# Patient Record
Sex: Female | Born: 1985 | Hispanic: Yes | Marital: Single | Smoking: Never smoker
Health system: Southern US, Community
[De-identification: ages and names within clinical notes are randomized; demographics above are authoritative.]

## PROBLEM LIST (undated history)

## (undated) DIAGNOSIS — R519 Headache, unspecified: Secondary | ICD-10-CM

---

## 2004-08-02 ENCOUNTER — Ambulatory Visit: Payer: Self-pay | Admitting: Family Medicine

## 2004-08-04 ENCOUNTER — Inpatient Hospital Stay: Payer: Self-pay

## 2006-04-17 ENCOUNTER — Inpatient Hospital Stay: Payer: Self-pay | Admitting: Obstetrics and Gynecology

## 2013-03-13 DIAGNOSIS — N879 Dysplasia of cervix uteri, unspecified: Secondary | ICD-10-CM | POA: Insufficient documentation

## 2016-10-26 HISTORY — PX: LEEP: SHX91

## 2019-05-24 ENCOUNTER — Other Ambulatory Visit: Payer: Self-pay | Admitting: Physician Assistant

## 2019-05-24 DIAGNOSIS — N6310 Unspecified lump in the right breast, unspecified quadrant: Secondary | ICD-10-CM

## 2019-06-03 ENCOUNTER — Ambulatory Visit
Admission: RE | Admit: 2019-06-03 | Discharge: 2019-06-03 | Disposition: A | Discharge status: Home / Self Care | Payer: Self-pay | Source: Ambulatory Visit | Attending: Physician Assistant | Admitting: Physician Assistant

## 2019-06-03 ENCOUNTER — Other Ambulatory Visit: Payer: Self-pay

## 2019-06-03 DIAGNOSIS — N6310 Unspecified lump in the right breast, unspecified quadrant: Secondary | ICD-10-CM

## 2019-06-05 ENCOUNTER — Ambulatory Visit: Payer: Self-pay | Attending: Oncology | Admitting: *Deleted

## 2019-06-05 ENCOUNTER — Other Ambulatory Visit: Payer: Self-pay

## 2019-06-05 ENCOUNTER — Encounter: Payer: Self-pay | Admitting: *Deleted

## 2019-06-05 DIAGNOSIS — N63 Unspecified lump in unspecified breast: Secondary | ICD-10-CM

## 2019-06-05 NOTE — Progress Notes (Signed)
A phone conversation was used to enroll patient into our BCCCP program Due to Covid 19 pandemic a verbal consent was completed using 2 patient identifiers.  The patient speaks [place] and is a [place] citizen.  Patient was referred by the [facility] to the [facility] for a diagnostic mammogram and ultrasound on 06/03/19 to evaluate a right breast mass.  [place] were used to pay for the mammogram since the patient is uninsured.  Patient has been screened for eligibility.  She does not have any insurance, Medicare or Medicaid.  She also meets financial eligibility.  The mammogram was a birads 4 showing :  Targeted ultrasound is performed, showing a hypoechoic slightly heterogeneous lobulated mass in the right breast at 10 o'clock 6 cm from the nipple measuring 2.5 x 1.5 x 2.4 cm. There are 2 benign appearing cysts in the right breast at 10 o'clock 5 cm from the nipple measuring 1.8 x 1.4 x 1.2 cm and 0.7 x 0.4 x 0.7 cm. Sonographic evaluation of the right axilla does not show any enlarged adenopathy.  Patient states she has had a right breast mass for about 3 years, but in the last month noticed that the mass was enlarging. States she saw her primary care provider and was referred for mammography.   Reviewed mammogram results with the patient and need for a biopsy.  She is agreeable.  Orders placed for a ultrasound guided right breast biopsy.  Message sent to Dollene Cleveland at the [facility] to schedule the biopsy under the [facility] program.  If the patient has not heard from the breast center in a couple of days she was encouraged to give them a call.  Will follow up per BCCCP protocol.

## 2019-06-07 ENCOUNTER — Other Ambulatory Visit: Payer: Self-pay

## 2019-06-07 ENCOUNTER — Ambulatory Visit
Admission: RE | Admit: 2019-06-07 | Discharge: 2019-06-07 | Disposition: A | Discharge status: Home / Self Care | Payer: Self-pay | Source: Ambulatory Visit | Attending: Oncology | Admitting: Oncology

## 2019-06-07 DIAGNOSIS — N63 Unspecified lump in unspecified breast: Secondary | ICD-10-CM

## 2019-06-10 LAB — SURGICAL PATHOLOGY

## 2019-06-13 ENCOUNTER — Encounter: Payer: Self-pay | Admitting: *Deleted

## 2019-06-13 NOTE — Progress Notes (Signed)
Called patient via Cathy Garrett, the interpreter.  Patient has benign breast biopsy of a fibroadenoma.  She states she would like to have it removed.  I have scheduled her to see Dr. Dahlia Byes at [place] on 07/03/19 @ 11:30.  She was made aware that BCCCP pays only certain codes and does not cover the OR, anesthesia or other charges associated with surgery.  Informed she could always apply for charity care if needed.

## 2019-07-03 ENCOUNTER — Other Ambulatory Visit: Payer: Self-pay

## 2019-07-03 ENCOUNTER — Encounter: Payer: Self-pay | Admitting: Surgery

## 2019-07-03 ENCOUNTER — Ambulatory Visit (INDEPENDENT_AMBULATORY_CARE_PROVIDER_SITE_OTHER): Payer: Self-pay | Admitting: Surgery

## 2019-07-03 VITALS — BP 99/64 | HR 60 | Temp 97.9°F | Ht 62.0 in | Wt 149.0 lb

## 2019-07-03 DIAGNOSIS — D241 Benign neoplasm of right breast: Secondary | ICD-10-CM

## 2019-07-03 NOTE — Patient Instructions (Addendum)
Patient to call us when she is ready to schedule excision right breast .

## 2019-07-05 NOTE — Progress Notes (Signed)
Patient ID: Cathy Garrett, female   DOB: 10-04-1986, 33 y.o.   MRN: [id number]  HPI Cathy Garrett is a 33 y.o. female seen for a right breast mass.  She reports that she has had this for about 3 years or so.  She does experience some discomfort on the left breast.  She did underwent a mammogram and ultrasound that I have personally reviewed showing a 2.5 cm lesion at 10:00 on the right breast.  There are also 2 benign cysts.  The main mass was biopsy personally reviewed the results consistent with fibroadenoma.  Of note the patient denies any previous hormonal therapy or birth control.  No family history of breast cancer.  Menarche at age 9 and she has had 2 pregnancies.  She does endorse breast-feeding before.  Denies any nipple discharge no skin changes.  The mass is tender to palpation worsening when she wears a tight bra.  Lumpectomies.  HPI  No past medical history on file.  Past Surgical History:  Procedure Laterality Date  . LEEP  10/26/2016   [facility]    No family history on file.  Social History Social History   Tobacco Use  . Smoking status: Never Smoker  . Smokeless tobacco: Never Used  Substance Use Topics  . Alcohol use: Yes  . Drug use: Never    No Known Allergies  Current Outpatient Medications  Medication Sig Dispense Refill  . Calcium Carb-Cholecalciferol (ALL DAY CALCIUM PO) Take by mouth.     No current facility-administered medications for this visit.      Review of Systems Full ROS  was asked and was negative except for the information on the HPI  Physical Exam Blood pressure 99/64, pulse 60, temperature 97.9 F (36.6 C), height 5\' 2"  (1.575 m), weight 149 lb (67.6 kg), last menstrual period 06/24/2019, SpO2 97 %. CONSTITUTIONAL: NAD EYES: Pupils are equal, round, and reactive to light, Sclera are non-icteric. EARS, NOSE, MOUTH AND THROAT: The oropharynx is clear. The oral mucosa is pink and moist. Hearing is intact to  voice. LYMPH NODES:  Lymph nodes in the neck are normal. RESPIRATORY:  Lungs are clear. There is normal respiratory effort, with equal breath sounds bilaterally, and without pathologic use of accessory muscles. CARDIOVASCULAR: Heart is regular without murmurs, gallops, or rubs. BREAST: There is 3 cm mass right outer upper quadrant located at 10:00 o'clock that is mobile mildly tender to palpation. No evidence of nipple discharge nipple inversion discharge or any lymphadenopathy. GI: The abdomen is  soft, nontender, and nondistended. There are no palpable masses. There is no hepatosplenomegaly. There are normal bowel sounds in all quadrants. GU: Rectal deferred.   MUSCULOSKELETAL: Normal muscle strength and tone. No cyanosis or edema.   SKIN: Turgor is good and there are no pathologic skin lesions or ulcers. NEUROLOGIC: Motor and sensation is grossly normal. Cranial nerves are grossly intact. PSYCH:  Oriented to person, place and time. Affect is normal.  Data Reviewed  I have personally reviewed the patient's imaging, laboratory findings and medical records.    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  Discussed with the patient in detail regarding her disease process.  Given that is symptomatic I do recommend excision.  The patient wants to think about it and apparently she had some financial concerns that she wishes to address before committing to excision.  I have discussed with the patient in detail about that lumpectomy and what it involves.  Risk, benefits  and possible implications including but not limited to: Bleeding, infection, breast deformity.  Chronic pain.  She understands and wishes to proceed.  When she is ready for excisional biopsy of the right breast fibroadenoma we will go ahead and put her on the schedule.  Caroleen Hamman, MD FACS General Surgeon 07/05/2019, 7:56 AM

## 2019-07-07 ENCOUNTER — Encounter: Payer: Self-pay | Admitting: Surgery

## 2020-03-13 ENCOUNTER — Other Ambulatory Visit: Payer: Self-pay | Admitting: Physician Assistant

## 2020-03-13 DIAGNOSIS — M25572 Pain in left ankle and joints of left foot: Secondary | ICD-10-CM

## 2020-03-13 DIAGNOSIS — G8929 Other chronic pain: Secondary | ICD-10-CM

## 2020-03-26 ENCOUNTER — Ambulatory Visit
Admission: RE | Admit: 2020-03-26 | Discharge: 2020-03-26 | Disposition: A | Discharge status: Home / Self Care | Payer: Medicaid Other | Source: Ambulatory Visit | Attending: Physician Assistant | Admitting: Physician Assistant

## 2020-03-26 DIAGNOSIS — M25572 Pain in left ankle and joints of left foot: Secondary | ICD-10-CM | POA: Insufficient documentation

## 2020-03-26 DIAGNOSIS — G8929 Other chronic pain: Secondary | ICD-10-CM | POA: Insufficient documentation

## 2020-04-24 ENCOUNTER — Other Ambulatory Visit: Payer: Self-pay

## 2020-04-24 ENCOUNTER — Inpatient Hospital Stay: Payer: Medicaid Other

## 2020-04-24 ENCOUNTER — Encounter: Payer: Self-pay | Admitting: Oncology

## 2020-04-24 ENCOUNTER — Inpatient Hospital Stay: Payer: Medicaid Other | Attending: Oncology | Admitting: Oncology

## 2020-04-24 VITALS — BP 107/70 | HR 46 | Temp 95.9°F | Resp 16 | Wt 152.7 lb

## 2020-04-24 DIAGNOSIS — D241 Benign neoplasm of right breast: Secondary | ICD-10-CM

## 2020-04-24 DIAGNOSIS — N63 Unspecified lump in unspecified breast: Secondary | ICD-10-CM

## 2020-04-24 NOTE — Progress Notes (Signed)
Hematology/Oncology Consult note [facility] Telephone:([phone] [phone]   Patient Care Team: Center, [facility] as PCP - General (General Practice) Rico Junker, RN as Registered Nurse Theodore Demark, RN as Registered Nurse  REFERRING PROVIDER: Donnie Coffin, MD  CHIEF COMPLAINTS/REASON FOR VISIT:  Evaluation of right breast pain for cyst  HISTORY OF PRESENTING ILLNESS:   Cathy Garrett is a  34 y.o.  female with PMH listed below was seen in consultation at the request of  Aycock, Ngwe A, MD  for evaluation of right breast cyst  Patient reports right breast palpable mass with pain.  She has had work-up done last year. 06/03/2019 mammogram diagnostic bilateral showed indeterminate mass in the 10:00 region of the right breast.  Ultrasound-guided biopsy of the right breast mass showed negative for atypia or malignancy.  Positive for fibroadenoma. Patient has been evaluated by Dr. Dahlia Byes and was recommended for surgical resection given that she is symptomatic. Due to financial reasons, patient has not had surgery yet Patient reports that she continues to have symptoms.  Pain level fluctuates, associated with menstrual period. She denies any personal history of breast biopsy, family history of breast cancer. Denies any nipple discharge She reports that she had blood work done at primary care provider office and has been more normal.  Denies any easy bruising, abnormal bleeding problems.  She did mention abnormal Pap smear for which she follows up with PCP. Review of Systems  Constitutional: Negative for appetite change, chills, fatigue and fever.  HENT:   Negative for hearing loss and voice change.   Eyes: Negative for eye problems.  Respiratory: Negative for chest tightness and cough.   Cardiovascular: Negative for chest pain.  Gastrointestinal: Negative for abdominal distention, abdominal pain and blood in stool.    Endocrine: Negative for hot flashes.  Genitourinary: Negative for difficulty urinating and frequency.   Musculoskeletal: Negative for arthralgias.  Skin: Negative for itching and rash.  Neurological: Negative for extremity weakness.  Hematological: Negative for adenopathy.  Psychiatric/Behavioral: Negative for confusion.    MEDICAL HISTORY:  History reviewed. No pertinent past medical history.  SURGICAL HISTORY: Past Surgical History:  Procedure Laterality Date  . LEEP  10/26/2016   [facility]    SOCIAL HISTORY: Social History   Socioeconomic History  . Marital status: Single    Spouse name: Not on file  . Number of children: Not on file  . Years of education: Not on file  . Highest education level: Not on file  Occupational History  . Not on file  Tobacco Use  . Smoking status: Never Smoker  . Smokeless tobacco: Never Used  Vaping Use  . Vaping Use: Never used  Substance and Sexual Activity  . Alcohol use: Yes  . Drug use: Never  . Sexual activity: Not on file  Other Topics Concern  . Not on file  Social History Narrative  . Not on file   Social Determinants of Health   Financial Resource Strain:   . Difficulty of Paying Living Expenses:   Food Insecurity:   . Worried About Charity fundraiser in the Last Year:   . Arboriculturist in the Last Year:   Transportation Needs:   . Film/video editor (Medical):   Marland Kitchen Lack of Transportation (Non-Medical):   Physical Activity:   . Days of Exercise per Week:   . Minutes of Exercise per Session:   Stress:   .  Feeling of Stress :   Social Connections:   . Frequency of Communication with Friends and Family:   . Frequency of Social Gatherings with Friends and Family:   . Attends Religious Services:   . Active Member of Clubs or Organizations:   . Attends Archivist Meetings:   Marland Kitchen Marital Status:   Intimate Partner Violence:   . Fear of Current or Ex-Partner:   . Emotionally Abused:   Marland Kitchen  Physically Abused:   . Sexually Abused:     FAMILY HISTORY: Family History  Problem Relation Age of Onset  . Breast cancer Neg Hx     ALLERGIES:  has No Known Allergies.  MEDICATIONS:  Current Outpatient Medications  Medication Sig Dispense Refill  . Multiple Vitamins-Minerals (MULTIPLE VITAMINS/WOMENS PO) Take by mouth.    . Calcium Carb-Cholecalciferol (ALL DAY CALCIUM PO) Take by mouth. (Patient not taking: Reported on 04/24/2020)     No current facility-administered medications for this visit.     PHYSICAL EXAMINATION: ECOG PERFORMANCE STATUS: 0 - Asymptomatic Vitals:   04/24/20 1002  BP: 107/70  Pulse: (!) 46  Resp: 16  Temp: (!) 95.9 F (35.5 C)  SpO2: 100%   Filed Weights   04/24/20 1002  Weight: 152 lb 11.2 oz (69.3 kg)    Physical Exam Constitutional:      General: She is not in acute distress. HENT:     Head: Normocephalic and atraumatic.  Eyes:     General: No scleral icterus. Cardiovascular:     Rate and Rhythm: Normal rate and regular rhythm.     Heart sounds: Normal heart sounds.  Pulmonary:     Effort: Pulmonary effort is normal. No respiratory distress.     Breath sounds: No wheezing.  Abdominal:     General: Bowel sounds are normal. There is no distension.     Palpations: Abdomen is soft.  Musculoskeletal:        General: No deformity. Normal range of motion.     Cervical back: Normal range of motion and neck supple.  Skin:    General: Skin is warm and dry.     Findings: No erythema or rash.  Neurological:     Mental Status: She is alert and oriented to person, place, and time. Mental status is at baseline.     Cranial Nerves: No cranial nerve deficit.     Coordination: Coordination normal.  Psychiatric:        Mood and Affect: Mood normal.   Breast exam was performed in seated and lying down position. Right upper outer quadrant palpable tender mass about 3 cm, smooth, mobile. No palpable left breast mass.  No palpable axillary  lymphadenopathy.  LABORATORY DATA:  I have reviewed the data as listed No results found for: WBC, HGB, HCT, MCV, PLT No results for input(s): NA, K, CL, CO2, GLUCOSE, BUN, CREATININE, CALCIUM, GFRNONAA, GFRAA, PROT, ALBUMIN, AST, ALT, ALKPHOS, BILITOT, BILIDIR, IBILI in the last 8760 hours. Iron/TIBC/Ferritin/ %Sat No results found for: IRON, TIBC, FERRITIN, IRONPCTSAT    RADIOGRAPHIC STUDIES: I have personally reviewed the radiological images as listed and agreed with the findings in the report. DG Ankle 2 Views Left  Result Date: 03/26/2020 CLINICAL DATA:  Chronic pain EXAM: LEFT ANKLE - 2 VIEW COMPARISON:  None. FINDINGS: Frontal and lateral views were obtained. No fracture or joint effusion. The joint spaces appear unremarkable. No erosive change. Ankle mortise appears intact. IMPRESSION: No evident fracture or arthropathy.  Ankle mortise appears intact. Electronically  Signed   By: Lowella Grip III M.D.   On: 03/26/2020 16:23      ASSESSMENT & PLAN:  1. Lump or mass in breast    Biopsy-proven right breast fibroadenoma Mammogram images were independently reviewed by me and discussed with patient. Discussed with patient that fibroadenoma is a benign breast lesion.  Given that she is symptomatic, I recommend surgical excision to rule out malignant changes and confirm the diagnosis. Patient has been seen and evaluated by surgery last year.  I will refer patient back to Dr.Pabone.    Patient denies any family history of breast cancer. If surgical resection shows simple fibroadenoma, she does not need to follow-up with me.  Continue age-appropriate cancer screening with primary care provider. Orders Placed This Encounter  Procedures  . Ambulatory referral to General Surgery    Referral Priority:   Routine    Referral Type:   Surgical    Referral Reason:   Specialty Services Required    Referred to Provider:   Jules Husbands, MD    Requested Specialty:   General Surgery     Number of Visits Requested:   1    All questions were answered. The patient knows to call the clinic with any problems questions or concerns.  cc Donnie Coffin, MD    Return of visit:  Thank you for this kind referral and the opportunity to participate in the care of this patient. A copy of today's note is routed to referring provider    Earlie Server, MD, PhD Hematology Oncology [facility] Pager- [phone] 04/24/2020

## 2020-04-24 NOTE — Progress Notes (Signed)
Patient here for right breast cyst.

## 2020-04-29 ENCOUNTER — Ambulatory Visit: Payer: Self-pay | Admitting: Surgery

## 2020-05-06 ENCOUNTER — Ambulatory Visit: Payer: Self-pay | Admitting: Surgery

## 2020-05-11 ENCOUNTER — Encounter: Payer: Self-pay | Admitting: Surgery

## 2020-05-11 ENCOUNTER — Other Ambulatory Visit: Payer: Self-pay | Admitting: Surgery

## 2020-05-11 ENCOUNTER — Ambulatory Visit (INDEPENDENT_AMBULATORY_CARE_PROVIDER_SITE_OTHER): Payer: Medicaid Other | Admitting: Surgery

## 2020-05-11 ENCOUNTER — Other Ambulatory Visit: Payer: Self-pay

## 2020-05-11 ENCOUNTER — Telehealth: Payer: Self-pay | Admitting: Surgery

## 2020-05-11 VITALS — BP 110/66 | HR 60 | Temp 98.6°F | Ht 63.0 in | Wt 154.4 lb

## 2020-05-11 DIAGNOSIS — D241 Benign neoplasm of right breast: Secondary | ICD-10-CM

## 2020-05-11 NOTE — Telephone Encounter (Signed)
Pt has been advised of Pre-Admission date/time, COVID Testing date and Surgery date.  Surgery Date: 05/21/20 Preadmission Testing Date: 05/15/20 (phone 8a-1p) Covid Testing Date: 05/19/20 - patient advised to go to the [facility] ([place]) between 8a-1p   Patient has been made aware to call [phone], between 1-3:00pm the day before surgery, to find out what time to arrive for surgery.

## 2020-05-11 NOTE — Patient Instructions (Addendum)
Cathy Garrett, nuestra programadora de [place], se comunicar con usted dentro de las prximas 24 a 48 horas. Durante esa llamada, hablar sobre la preparacin antes de la ciruga y tambin discutir las diferentes fechas y horas para la [place]. Tenga a mano la hoja AZUL cuando se comunique con usted. Si tiene [place] pregunta o inquietud con respecto a la ciruga, no dude en llamar a nuestra oficina.   Fibroadenoma Fibroadenoma Un fibroadenoma es un bulto (tumor) en la mama. El bulto es benigno. Esto significa que no es cncer. Puede moverse debajo de la piel al tocarlo. Este tipo de tumor puede desarrollarse en una sola mama o en ambas. Se desconoce la causa de esta afeccin. Algunos bultos son demasiado pequeos para poder sentirlos. Otros pueden sentirse como bultos firmes, redondos, lisos y [place]. Este tipo de bulto puede tratarse con exmenes regulares de las [place]. Se le realizarn exmenes para detectar cambios en el tumor. En algunos casos, el tumor se extirpa. El tumor puede extirparse si:  Es grande.  Sigue creciendo.  Causa dolor o cambios en la piel de la mama.  La paciente es una nia adolescente. Los bultos en las nias pequeas tienden a crecer con Physiological scientist. Siga estas indicaciones en su casa: Exmenes de mamas   Examnese las [employer] en su casa como se lo haya indicado el mdico. Informe cualquier cambio o inquietud que tenga. Preste atencin a lo siguiente: ? El tamao del tumor. ? La apariencia y la sensibilidad de la piel en las [place]. ? La apariencia y la sensibilidad de los pezones. Indicaciones generales  Si le extirparon un bulto, siga las indicaciones del mdico para el cuidado en el hogar despus de la [place].  No consuma ningn producto que contenga nicotina o tabaco, como cigarrillos y Psychologist, sport and exercise. Estos pueden aumentar su riesgo de Optician, dispensing. Si necesita ayuda para dejar de fumar, consulte al mdico.  Concurra a todas las visitas de  seguimiento como se lo haya indicado el mdico. Esto es importante. Necesitar realizarse exmenes mamarios regularmente. Comunquese con un mdico si:  El bulto cambia de tamao o se siente diferente.  El bulto comienza a Engineer, drilling.  Encuentra un bulto nuevo.  Nota cualquier cambio en la piel de la mama.  Nota cualquier cambio en el pezn.  Le sale lquido del pezn.  Tiene enrojecimiento alrededor del pezn. Resumen  Un fibroadenoma es un bulto (tumor) en la mama. El bulto es benigno. Esto significa que no es cncer.  Este tumor puede sentirse como un bulto firme, redondo, liso y [employer]. Algunos fibroadenomas son demasiado pequeos para poder sentirlos al tacto.  Tener esta afeccin puede aumentar ligeramente el riesgo de contraer cncer de mama en el futuro.  Examnese las [employer] en su casa. Est atenta a los [employer] del bulto. Adems, observe si presenta cambios en el aspecto y la sensibilidad de los pezones y la piel de las [place].  Comunquese con su mdico si el bulto se agranda o comienza a Education administrator. [place]ems, informe al mdico si presenta si presenta cambios en el aspecto de los pezones y en la sensibilidad de la piel de las [place]. Esta informacin no tiene Marine scientist el consejo del mdico. Asegrese de hacerle al mdico cualquier pregunta que tenga. Document Revised: 11/21/2017 Document Reviewed: 11/21/2017 Elsevier Patient Education  2020 [place] de [employer] Breast Self-Awareness El autoexamen de mamas es para Civil engineer, contracting la apariencia y la sensibilidad de las [place].  Es importante autoexaminarse las [place]. Permite detectar un problema en las mamas a tiempo mientras todava es pequeo y puede tratarse. Todas las mujeres deben autoexaminarse las [place], incluso aquellas que se sometieron a implantes mamarios. Informe al mdico si advierte un cambio en las mamas. Lo que necesita:  Un espejo.  Una habitacin bien  iluminada. Cmo realizar el autoexamen de mamas El autoexamen de [place] es una forma de aprender qu es normal para sus mamas y revisar si hay cambios. Para hacer un autoexamen de las mamas: Busque cambios  1. Qutese toda la ropa por encima de la cintura. 2. Prese frente a un espejo en una habitacin con buena iluminacin. 3. Apoye las manos en las caderas. 4. Empuje hacia abajo con las manos. [place] y los pezones en el espejo para ver si una mama o un pezn se ve diferente del otro. Durante el examen, intente determinar si: ? La forma de una mama es diferente. ? El tamao de [place] mama es [place]. ? Hay arrugas, depresiones y protuberancias en [place] y no en la otra. 6. Observe cada mama para buscar cambios en la piel, por ejemplo: ? Enrojecimiento. ? Zonas escamosas. 7. Observe si hay cambios en los pezones, por ejemplo: ? Lquido alrededor [employer]. ? [place]. ? Hoyuelos. ? Enrojecimiento. ? Un cambio en el lugar de los pezones. [place] si hay cambios  1. Acustese en el piso boca arriba. 2. Plpese cada mama. Para hacerlo, siga estos pasos: ? Elija una mama para palpar. ? Coloque el brazo ms cercano a esa mama por encima de la cabeza. ? Use el otro brazo para palpar la zona del pezn de la mama. Plpese la zona con las yemas de los tres dedos del medio y haga crculos con los dedos. Con el primer crculo, presione suavemente. Con el segundo, ms fuerte. Con el tercero, an ms fuerte. ? Siga haciendo crculos con los dedos con las diferentes presiones a medida que desciende por la mama. Detngase cuando sienta las costillas. ? Desplace los dedos un poco hacia el centro del cuerpo. ? Empiece a hacer crculos con los dedos nuevamente y esta vez haga movimientos ascendentes hasta llegar a la clavcula. ? Siga haciendo crculos [place] y [place] llegar a la axila. Recuerde hacerlos con las tres presiones. ? Plpese la otra mama de la misma  forma. 3. Sintese o prese en la ducha o la baera. 4. Con agua jabonosa en la piel, plpese cada mama del mismo modo que lo hizo en el paso2 mientras estaba acostada en el piso. Anote sus hallazgos Anotar lo que encuentra puede ayudarla a recordar qu contarle al mdico. Faith los siguientes datos:  Qu es normal para cada mama.  Cualquier cambio que encuentre en cada mama, por ejemplo: ? La clase de cambios que encuentra. ? Si tiene dolor. ? Si hay bultos, su tamao y [place].  Cundo tuvo su ltimo perodo menstrual. Consejos generales  [facility].  Si est amamantando, el mejor momento para el examen de las mamas es despus de darle de Administrator, arts al beb o despus de usar un sacaleches.  Si tiene perodos menstruales, el mejor momento para hacerlo es de 5 a 7das despus de la finalizado el perodo menstrual.  Con el tiempo, se sentir cmoda con el autoexamen y Medical laboratory scientific officer a saber si hay cambios en sus mamas. Comunquese con un mdico si:  Observa un cambio en la forma o el  tamao de las mamas o los pezones.  Observa un cambio en la piel de las mamas o los pezones, como la piel enrojecida o escamosa.  Tiene secrecin de lquido proveniente de los pezones que no es normal.  [place] un ndulo o una zona engrosada que no tena antes.  Tiene dolor en las [place].  Tiene alguna inquietud [employer] de la [place]. Resumen  El autoexamen de mamas incluye buscar cambios en las [place], y tambin palpar para Hydrographic surveyor cualquier cambio en las [place].  El autoexamen de mamas debe hacerse frente a un espejo en una habitacin bien iluminada.  Debe revisarse las [employer]. Si tiene perodos menstruales, el mejor momento para hacerlo es de 5 a 7das despus de la finalizado el perodo menstrual.  Informe al mdico si ve cambios en las mamas, como cambios en el tamao, cambios en la piel, Social research officer, government o sensibilidad, o un lquido inusual que sale de los  pezones. Esta informacin no tiene Marine scientist el consejo del mdico. Asegrese de hacerle al mdico cualquier pregunta que tenga. Document Revised: 07/10/2018 Document Reviewed: 07/10/2018 Elsevier Patient Education  [place].

## 2020-05-13 ENCOUNTER — Encounter: Payer: Self-pay | Admitting: Surgery

## 2020-05-13 NOTE — H&P (View-Only) (Signed)
Outpatient Surgical Follow Up  05/13/2020  Cathy Garrett is an 34 y.o. female.   Chief Complaint  Patient presents with  . 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  There has been any biopsy showing evidence of fibroadenoma.  I have also personally reviewed the images include ultrasound and mammogram that are consistent with fibroadenoma.  Continues to have intermittent mild pain in the right breast no specific alleviating or aggravating factors.  Pain seems to be worse when she wears a tight bra.  She is otherwise healthy and is able to perform more than 6 METS of activity without any shortness of breath or chest pain.  No other prior breast biopsies.  History reviewed. No pertinent past medical history.  Past Surgical History:  Procedure Laterality Date  . LEEP  10/26/2016   [facility]    Family History  Problem Relation Age of Onset  . Breast cancer Neg Hx     Social History:  reports that she has never smoked. She has never used smokeless tobacco. She reports current alcohol use. She reports that she does not use drugs.  Allergies: No Known Allergies  Medications reviewed.    ROS Full ROS performed and is otherwise negative other than what is stated in HPI   BP 110/66   Pulse 60   Temp 98.6 F (37 C) (Oral)   Ht 5\' 3"  (1.6 m)   Wt 154 lb 6.4 oz (70 kg)   SpO2 98%   BMI 27.35 kg/m   Physical Exam CONSTITUTIONAL: NAD EYES: Pupils are equal, round, and reactive to light, Sclera are non-icteric. EARS, NOSE, MOUTH AND THROAT: The oropharynx is clear. The oral mucosa is pink and moist. Hearing is intact to voice. LYMPH NODES:  Lymph nodes in the neck are normal. RESPIRATORY:  Lungs are clear. There is normal respiratory effort, with equal breath sounds bilaterally, and without  pathologic use of accessory muscles. CARDIOVASCULAR: Heart is regular without murmurs, gallops, or rubs. BREAST: There is 43 cm mass right outer upper quadrant located at 10:00 o'clock that is mobile mildly tender to palpation. No evidence of nipple discharge nipple inversion discharge or any lymphadenopathy. GI: The abdomen is  soft, nontender, and nondistended. There are no palpable masses. There is no hepatosplenomegaly. There are normal bowel sounds in all quadrants. GU: Rectal deferred.   MUSCULOSKELETAL: Normal muscle strength and tone. No cyanosis or edema.   SKIN: Turgor is good and there are no pathologic skin lesions or ulcers. NEUROLOGIC: Motor and sensation is grossly normal. Cranial nerves are grossly intact. PSYCH:  Oriented to person, place and time. Affect is normal.     Assessment/Plan: 34 year old female with biopsy proven symptomatic fibroadenoma of the right breast.  Is now ready to have the surgery performed.  I do recommend lumpectomy/excisional biopsy.  Procedure discussed with the patient in detail.  Risks, benefits and possible occasions including but not limited to: Function, breast deformity, cosmetic issues, potential interventions also pathology upgrades. She understands and wishes to proceed.  Extensive counseling provided Greater than 50% of the 40 minutes  visit was spent in counseling/coordination of care   Caroleen Hamman, MD [place] [facility]

## 2020-05-13 NOTE — Progress Notes (Signed)
Outpatient Surgical Follow Up  05/13/2020  Cathy Garrett is an 34 y.o. female.   Chief Complaint  Patient presents with  . 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  There has been any biopsy showing evidence of fibroadenoma.  I have also personally reviewed the images include ultrasound and mammogram that are consistent with fibroadenoma.  Continues to have intermittent mild pain in the right breast no specific alleviating or aggravating factors.  Pain seems to be worse when she wears a tight bra.  She is otherwise healthy and is able to perform more than 6 METS of activity without any shortness of breath or chest pain.  No other prior breast biopsies.  History reviewed. No pertinent past medical history.  Past Surgical History:  Procedure Laterality Date  . LEEP  10/26/2016   [facility]    Family History  Problem Relation Age of Onset  . Breast cancer Neg Hx     Social History:  reports that she has never smoked. She has never used smokeless tobacco. She reports current alcohol use. She reports that she does not use drugs.  Allergies: No Known Allergies  Medications reviewed.    ROS Full ROS performed and is otherwise negative other than what is stated in HPI   BP 110/66   Pulse 60   Temp 98.6 F (37 C) (Oral)   Ht 5\' 3"  (1.6 m)   Wt 154 lb 6.4 oz (70 kg)   SpO2 98%   BMI 27.35 kg/m   Physical Exam CONSTITUTIONAL: NAD EYES: Pupils are equal, round, and reactive to light, Sclera are non-icteric. EARS, NOSE, MOUTH AND THROAT: The oropharynx is clear. The oral mucosa is pink and moist. Hearing is intact to voice. LYMPH NODES:  Lymph nodes in the neck are normal. RESPIRATORY:  Lungs are clear. There is normal respiratory effort, with equal breath sounds bilaterally, and without  pathologic use of accessory muscles. CARDIOVASCULAR: Heart is regular without murmurs, gallops, or rubs. BREAST: There is 43 cm mass right outer upper quadrant located at 10:00 o'clock that is mobile mildly tender to palpation. No evidence of nipple discharge nipple inversion discharge or any lymphadenopathy. GI: The abdomen is  soft, nontender, and nondistended. There are no palpable masses. There is no hepatosplenomegaly. There are normal bowel sounds in all quadrants. GU: Rectal deferred.   MUSCULOSKELETAL: Normal muscle strength and tone. No cyanosis or edema.   SKIN: Turgor is good and there are no pathologic skin lesions or ulcers. NEUROLOGIC: Motor and sensation is grossly normal. Cranial nerves are grossly intact. PSYCH:  Oriented to person, place and time. Affect is normal.     Assessment/Plan: 34 year old female with biopsy proven symptomatic fibroadenoma of the right breast.  Is now ready to have the surgery performed.  I do recommend lumpectomy/excisional biopsy.  Procedure discussed with the patient in detail.  Risks, benefits and possible occasions including but not limited to: Function, breast deformity, cosmetic issues, potential interventions also pathology upgrades. She understands and wishes to proceed.  Extensive counseling provided Greater than 50% of the 40 minutes  visit was spent in counseling/coordination of care   Caroleen Hamman, MD [place] Surgeon

## 2020-05-15 ENCOUNTER — Other Ambulatory Visit: Payer: Self-pay

## 2020-05-15 ENCOUNTER — Encounter
Admission: RE | Admit: 2020-05-15 | Discharge: 2020-05-15 | Disposition: A | Discharge status: Home / Self Care | Payer: Medicaid Other | Source: Ambulatory Visit | Attending: Surgery | Admitting: Surgery

## 2020-05-15 DIAGNOSIS — Z01812 Encounter for preprocedural laboratory examination: Secondary | ICD-10-CM | POA: Diagnosis present

## 2020-05-15 HISTORY — DX: Headache, unspecified: R51.9

## 2020-05-15 NOTE — Patient Instructions (Signed)
Your procedure is scheduled on: 05-21-20 THURSDAY Report to Same Day Surgery 2nd floor medical mall [facility] Entrance-take elevator on left to 2nd floor.  Check in with surgery information desk.) To find out your arrival time please call [phone] between 1PM - 3PM on 05-20-20 [facility]  Remember: Instructions that are not followed completely may result in serious medical risk, up to and including death, or upon the discretion of your surgeon and anesthesiologist your surgery may need to be rescheduled.    _x___ 1. Do not eat food after midnight the night before your procedure. NO GUM OR CANDY AFTER MIDNIGHT. You may drink clear liquids up to 2 hours before you are scheduled to arrive at the hospital for your procedure.  Do not drink clear liquids within 2 hours of your scheduled arrival to the hospital.  Clear liquids include  --Water or Apple juice without pulp  --Gatorade  --Black Coffee or Clear Tea (No milk, no creamers, do not add anything to the coffee or Tea-OK TO ADD SUGAR)     __x__ 2. No Alcohol for 24 hours before or after surgery.   __x__3. No Smoking or e-cigarettes for 24 prior to surgery.  Do not use any chewable tobacco products for at least 6 hour prior to surgery   ____  4. Bring all medications with you on the day of surgery if instructed.    __x__ 5. Notify your doctor if there is any change in your medical condition     (cold, fever, infections).    x___6. On the morning of surgery brush your teeth with toothpaste and water.  You may rinse your mouth with mouth wash if you wish.  Do not swallow any toothpaste or mouthwash.   Do not wear jewelry, make-up, hairpins, clips or nail polish.  Do not wear lotions, powders, or perfumes.   Do not shave 48 hours prior to surgery. Men may shave face and neck.  Do not bring valuables to the hospital.    [facility] is not responsible for any belongings or valuables.               Contacts, dentures or bridgework may  not be worn into surgery.  Leave your suitcase in the car. After surgery it may be brought to your room.  For patients admitted to the hospital, discharge time is determined by your  treatment team.  _  Patients discharged the day of surgery will not be allowed to drive home.  You will need someone to drive you home and stay with you the night of your procedure.    Please read over the following fact sheets that you were given:   [facility] Preparing for Surgery  ____ TAKE THE FOLLOWING MEDICATION THE MORNING OF SURGERY WITH A SMALL SIP OF WATER. These include:  1. NONE  2.  3.  4.  5.  6.  ____Fleets enema or Magnesium Citrate as directed.   _x___ Use CHG Soap or sage wipes as directed on instruction sheet   ____ Use inhalers on the day of surgery and bring to hospital day of surgery  ____ Stop Metformin and Janumet 2 days prior to surgery.    ____ Take 1/2 of usual insulin dose the night before surgery and none on the morning surgery.   ____ Follow recommendations from Cardiologist, Pulmonologist or PCP regarding stopping Aspirin, Coumadin, Plavix ,Eliquis, Effient, or Pradaxa, and Pletal.  X____Stop Anti-inflammatories such as Advil, Aleve, Ibuprofen, Motrin, Naproxen, Naprosyn,  Goodies powders or aspirin products NOW-OK to take Tylenol    ____ Stop supplements until after surgery.    ____ Bring C-Pap to the hospital.

## 2020-05-19 ENCOUNTER — Other Ambulatory Visit: Payer: Self-pay

## 2020-05-19 ENCOUNTER — Other Ambulatory Visit
Admission: RE | Admit: 2020-05-19 | Discharge: 2020-05-19 | Disposition: A | Discharge status: Home / Self Care | Payer: Medicaid Other | Source: Ambulatory Visit | Attending: Surgery | Admitting: Surgery

## 2020-05-19 DIAGNOSIS — Z01812 Encounter for preprocedural laboratory examination: Secondary | ICD-10-CM | POA: Diagnosis present

## 2020-05-19 DIAGNOSIS — Z20822 Contact with and (suspected) exposure to covid-19: Secondary | ICD-10-CM | POA: Insufficient documentation

## 2020-05-19 LAB — SARS CORONAVIRUS 2 (TAT 6-24 HRS): SARS Coronavirus 2: NEGATIVE

## 2020-05-21 ENCOUNTER — Encounter: Payer: Self-pay | Admitting: Surgery

## 2020-05-21 ENCOUNTER — Ambulatory Visit: Payer: Medicaid Other | Admitting: Anesthesiology

## 2020-05-21 ENCOUNTER — Encounter
Admission: RE | Disposition: A | Discharge status: Home / Self Care | Payer: Self-pay | Source: Home / Self Care | Attending: Surgery

## 2020-05-21 ENCOUNTER — Other Ambulatory Visit: Payer: Self-pay

## 2020-05-21 ENCOUNTER — Ambulatory Visit
Admission: RE | Admit: 2020-05-21 | Discharge: 2020-05-21 | Disposition: A | Discharge status: Home / Self Care | Payer: Medicaid Other | Source: Ambulatory Visit | Attending: Surgery | Admitting: Surgery

## 2020-05-21 ENCOUNTER — Ambulatory Visit
Admission: RE | Admit: 2020-05-21 | Discharge: 2020-05-21 | Disposition: A | Discharge status: Home / Self Care | Payer: Medicaid Other | Attending: Surgery | Admitting: Surgery

## 2020-05-21 DIAGNOSIS — D241 Benign neoplasm of right breast: Secondary | ICD-10-CM | POA: Diagnosis present

## 2020-05-21 DIAGNOSIS — Z803 Family history of malignant neoplasm of breast: Secondary | ICD-10-CM | POA: Insufficient documentation

## 2020-05-21 HISTORY — PX: BREAST LUMPECTOMY: SHX2

## 2020-05-21 LAB — POCT PREGNANCY, URINE: Preg Test, Ur: NEGATIVE

## 2020-05-21 SURGERY — BREAST LUMPECTOMY
Anesthesia: General | Laterality: Right

## 2020-05-21 MED ORDER — GLYCOPYRROLATE 0.2 MG/ML IJ SOLN
INTRAMUSCULAR | Status: AC
  Filled 2020-05-21: qty 1

## 2020-05-21 MED ORDER — SEVOFLURANE IN SOLN
RESPIRATORY_TRACT | Status: AC
  Filled 2020-05-21: qty 250

## 2020-05-21 MED ORDER — CEFAZOLIN SODIUM-DEXTROSE 2-4 GM/100ML-% IV SOLN
INTRAVENOUS | Status: AC
  Filled 2020-05-21: qty 100

## 2020-05-21 MED ORDER — CEFAZOLIN SODIUM-DEXTROSE 2-4 GM/100ML-% IV SOLN
2.0000 g | INTRAVENOUS | Status: AC
  Administered 2020-05-21: 2 g via INTRAVENOUS

## 2020-05-21 MED ORDER — ACETAMINOPHEN 500 MG PO TABS
ORAL_TABLET | ORAL | Status: AC
  Administered 2020-05-21: 1000 mg via ORAL
  Filled 2020-05-21: qty 2

## 2020-05-21 MED ORDER — DEXAMETHASONE SODIUM PHOSPHATE 10 MG/ML IJ SOLN
INTRAMUSCULAR | Status: DC | PRN
  Administered 2020-05-21: 10 mg via INTRAVENOUS

## 2020-05-21 MED ORDER — LIDOCAINE HCL (PF) 2 % IJ SOLN
INTRAMUSCULAR | Status: AC
  Filled 2020-05-21: qty 5

## 2020-05-21 MED ORDER — MIDAZOLAM HCL 5 MG/5ML IJ SOLN
INTRAMUSCULAR | Status: DC | PRN
  Administered 2020-05-21: 2 mg via INTRAVENOUS

## 2020-05-21 MED ORDER — FENTANYL CITRATE (PF) 100 MCG/2ML IJ SOLN
INTRAMUSCULAR | Status: AC
  Filled 2020-05-21: qty 2

## 2020-05-21 MED ORDER — ONDANSETRON HCL 4 MG/2ML IJ SOLN
INTRAMUSCULAR | Status: AC
  Filled 2020-05-21: qty 2

## 2020-05-21 MED ORDER — PROPOFOL 10 MG/ML IV BOLUS
INTRAVENOUS | Status: DC | PRN
  Administered 2020-05-21: 150 mg via INTRAVENOUS

## 2020-05-21 MED ORDER — GABAPENTIN 300 MG PO CAPS
ORAL_CAPSULE | ORAL | Status: AC
  Administered 2020-05-21: 300 mg via ORAL
  Filled 2020-05-21: qty 1

## 2020-05-21 MED ORDER — ACETAMINOPHEN 500 MG PO TABS: 1000.0000 mg | ORAL_TABLET | ORAL | Status: AC

## 2020-05-21 MED ORDER — CHLORHEXIDINE GLUCONATE 0.12 % MT SOLN: 15.0000 mL | Freq: Once | OROMUCOSAL | Status: AC

## 2020-05-21 MED ORDER — CHLORHEXIDINE GLUCONATE CLOTH 2 % EX PADS: 6.0000 | MEDICATED_PAD | Freq: Once | CUTANEOUS | Status: DC

## 2020-05-21 MED ORDER — PROPOFOL 10 MG/ML IV BOLUS
INTRAVENOUS | Status: AC
  Filled 2020-05-21: qty 20

## 2020-05-21 MED ORDER — LACTATED RINGERS IV SOLN: INTRAVENOUS | Status: DC

## 2020-05-21 MED ORDER — GABAPENTIN 300 MG PO CAPS: 300.0000 mg | ORAL_CAPSULE | ORAL | Status: AC

## 2020-05-21 MED ORDER — FAMOTIDINE 20 MG PO TABS: 20.0000 mg | ORAL_TABLET | Freq: Once | ORAL | Status: AC

## 2020-05-21 MED ORDER — BUPIVACAINE-EPINEPHRINE (PF) 0.25% -1:200000 IJ SOLN
INTRAMUSCULAR | Status: DC | PRN
  Administered 2020-05-21: 30 mL via PERINEURAL

## 2020-05-21 MED ORDER — CELECOXIB 200 MG PO CAPS: 200.0000 mg | ORAL_CAPSULE | ORAL | Status: AC

## 2020-05-21 MED ORDER — ORAL CARE MOUTH RINSE: 15.0000 mL | Freq: Once | OROMUCOSAL | Status: AC

## 2020-05-21 MED ORDER — ONDANSETRON HCL 4 MG/2ML IJ SOLN
INTRAMUSCULAR | Status: DC | PRN
  Administered 2020-05-21: 4 mg via INTRAVENOUS

## 2020-05-21 MED ORDER — FENTANYL CITRATE (PF) 100 MCG/2ML IJ SOLN: 25.0000 ug | INTRAMUSCULAR | Status: DC | PRN

## 2020-05-21 MED ORDER — CELECOXIB 200 MG PO CAPS
ORAL_CAPSULE | ORAL | Status: AC
  Administered 2020-05-21: 200 mg via ORAL
  Filled 2020-05-21: qty 1

## 2020-05-21 MED ORDER — FAMOTIDINE 20 MG PO TABS
ORAL_TABLET | ORAL | Status: AC
  Administered 2020-05-21: 20 mg via ORAL
  Filled 2020-05-21: qty 1

## 2020-05-21 MED ORDER — FENTANYL CITRATE (PF) 100 MCG/2ML IJ SOLN
INTRAMUSCULAR | Status: DC | PRN
  Administered 2020-05-21 (×2): 50 ug via INTRAVENOUS

## 2020-05-21 MED ORDER — MIDAZOLAM HCL 2 MG/2ML IJ SOLN
INTRAMUSCULAR | Status: AC
  Filled 2020-05-21: qty 2

## 2020-05-21 MED ORDER — HYDROCODONE-ACETAMINOPHEN 5-325 MG PO TABS
1.0000 | ORAL_TABLET | ORAL | 0 refills | Status: AC | PRN
Start: 2020-05-21 — End: ?

## 2020-05-21 MED ORDER — ONDANSETRON HCL 4 MG/2ML IJ SOLN: 4.0000 mg | Freq: Once | INTRAMUSCULAR | Status: DC | PRN

## 2020-05-21 MED ORDER — DEXAMETHASONE SODIUM PHOSPHATE 10 MG/ML IJ SOLN
INTRAMUSCULAR | Status: AC
  Filled 2020-05-21: qty 1

## 2020-05-21 MED ORDER — CHLORHEXIDINE GLUCONATE 0.12 % MT SOLN
OROMUCOSAL | Status: AC
  Administered 2020-05-21: 15 mL via OROMUCOSAL
  Filled 2020-05-21: qty 15

## 2020-05-21 MED ORDER — SODIUM CHLORIDE 0.9 % IV SOLN
INTRAVENOUS | Status: DC | PRN
  Administered 2020-05-21: 20 mL

## 2020-05-21 MED ORDER — LIDOCAINE HCL (PF) 2 % IJ SOLN
INTRAMUSCULAR | Status: DC | PRN
  Administered 2020-05-21: 50 mg

## 2020-05-21 MED ORDER — BUPIVACAINE LIPOSOME 1.3 % IJ SUSP
INTRAMUSCULAR | Status: AC
  Filled 2020-05-21: qty 20

## 2020-05-21 MED ORDER — GLYCOPYRROLATE 0.2 MG/ML IJ SOLN
INTRAMUSCULAR | Status: DC | PRN
  Administered 2020-05-21: .2 mg via INTRAVENOUS

## 2020-05-21 SURGICAL SUPPLY — 46 items
ADH SKN CLS APL DERMABOND .7 (GAUZE/BANDAGES/DRESSINGS) ×1
APL PRP STRL LF DISP 70% ISPRP (MISCELLANEOUS) ×1
APPLIER CLIP 9.375 SM OPEN (CLIP)
APR CLP SM 9.3 20 MLT OPN (CLIP)
BINDER BREAST MEDIUM (GAUZE/BANDAGES/DRESSINGS) ×2 IMPLANT
BLADE SURG 15 STRL LF DISP TIS (BLADE) ×1 IMPLANT
BLADE SURG 15 STRL SS (BLADE) ×2
CANISTER SUCT 1200ML W/VALVE (MISCELLANEOUS) ×2 IMPLANT
CHLORAPREP W/TINT 26 (MISCELLANEOUS) ×2 IMPLANT
CLIP APPLIE 9.375 SM OPEN (CLIP) IMPLANT
CNTNR SPEC 2.5X3XGRAD LEK (MISCELLANEOUS) ×1
CONT SPEC 4OZ STER OR WHT (MISCELLANEOUS) ×1
CONT SPEC 4OZ STRL OR WHT (MISCELLANEOUS) ×1
CONTAINER SPEC 2.5X3XGRAD LEK (MISCELLANEOUS) ×1 IMPLANT
COVER PROBE FLX POLY STRL (MISCELLANEOUS) ×2 IMPLANT
COVER WAND RF STERILE (DRAPES) IMPLANT
DERMABOND ADVANCED (GAUZE/BANDAGES/DRESSINGS) ×1
DERMABOND ADVANCED .7 DNX12 (GAUZE/BANDAGES/DRESSINGS) ×1 IMPLANT
DRAPE INCISE IOBAN 66X45 STRL (DRAPES) ×2 IMPLANT
DRAPE LAPAROTOMY TRNSV 106X77 (MISCELLANEOUS) ×2 IMPLANT
ELECT REM PT RETURN 9FT ADLT (ELECTROSURGICAL) ×2
ELECTRODE REM PT RTRN 9FT ADLT (ELECTROSURGICAL) ×1 IMPLANT
GLOVE BIO SURGEON STRL SZ7 (GLOVE) ×2 IMPLANT
GLOVE INDICATOR 7.5 STRL GRN (GLOVE) ×2 IMPLANT
GOWN STRL REUS W/ TWL LRG LVL3 (GOWN DISPOSABLE) ×2 IMPLANT
GOWN STRL REUS W/TWL LRG LVL3 (GOWN DISPOSABLE) ×4
KIT MARKER MARGIN INK (KITS) IMPLANT
KIT TURNOVER KIT A (KITS) ×2 IMPLANT
LABEL OR SOLS (LABEL) ×2 IMPLANT
MARGIN MAP 10MM (MISCELLANEOUS) ×2 IMPLANT
MARKER MARGIN CORRECT CLIP (MARKER) IMPLANT
NEEDLE HYPO 22GX1.5 SAFETY (NEEDLE) ×2 IMPLANT
PACK BASIN MINOR (MISCELLANEOUS) ×2 IMPLANT
SLEVE PROBE SENORX GAMMA FIND (MISCELLANEOUS) IMPLANT
SPONGE LAP 18X18 RF (DISPOSABLE) ×2 IMPLANT
SUT ETHILON 3-0 FS-10 30 BLK (SUTURE) ×2
SUT MNCRL 4-0 (SUTURE) ×2
SUT MNCRL 4-0 27XMFL (SUTURE) ×1
SUT SILK 2 0 SH (SUTURE) ×2 IMPLANT
SUT VIC AB 2-0 CT1 (SUTURE) ×2 IMPLANT
SUT VIC AB 3-0 SH 27 (SUTURE) ×4
SUT VIC AB 3-0 SH 27X BRD (SUTURE) ×2 IMPLANT
SUTURE EHLN 3-0 FS-10 30 BLK (SUTURE) ×1 IMPLANT
SUTURE MNCRL 4-0 27XMF (SUTURE) ×1 IMPLANT
SYR 20ML LL LF (SYRINGE) ×2 IMPLANT
WATER STERILE IRR 1000ML POUR (IV SOLUTION) ×2 IMPLANT

## 2020-05-21 NOTE — Op Note (Signed)
Pre-operative Diagnosis: Right symptomatic and enlarging fibroadenoma    Post-operative Diagnosis: Same   Surgeon: Caroleen Hamman,  MD FACS  Anesthesia: GETA  Procedure: Right  Partial mastectomy ultrasound guided,  Complex closure with tissue rearrangements measuring 49cms   Findings: Clip and lesion within xray specimen   Estimated Blood Loss: Minimal         Drains: None         Specimens: partial mastectomy with labels,      Complications: none                Condition: Stable   Procedure Details  The patient was seen again in the Holding Room. The benefits, complications, treatment options, and expected outcomes were discussed with the patient. The risks of bleeding, infection, recurrence of symptoms, failure to resolve symptoms, hematoma, seroma, open wound, cosmetic deformity, and the need for further surgery were discussed.  The patient was taken to Operating Room, identified  and the procedure verified.  A Time Out was held and the above information confirmed.  Prior to the induction of general anesthesia, antibiotic prophylaxis was administered. VTE prophylaxis was in place. Appropriate anesthesia was then administered and tolerated well. The chest was prepped with Chloraprep and draped in the sterile fashion. The patient was positioned in the supine position.   Attention was turned to breast where an incision was made in a crescent moon shaped fashion after identifying the mass and clip with an ultrasound probe.  We visualized the mass and the clip and proceeded to create our dissection based on the imaging findings.. Dissection around the mass to perform a partial mastectomy with adequate margins was performed. This was done with electrocautery and sharp dissection. Hemostasis was with electrocautery.  Tissue advancement flaps were performed to decrease the volume deficit in the area of the resection.  Please note that the total void area was 7 x 7 cm equals 49 cm.  The  chest wall flaps were created by incising the breast parenchyma from the pectoralis fascia in a circumferential method.  The breast parenchyma was then reapproximated in a deep to superficial fashion using interrupted 2-0 Vicryl sutures.  Please note that I placed 2 deep layers of 2-0 Vicryl's. The proximal Marcaine was placed within the breast tissue and incision site Once assuring that hemostasis was adequate and checked multiple times the wound was closed with 4-0 subcuticular Monocryl sutures. Dermabond was placed  Patient was taken to the recovery room in stable condition.   Caroleen Hamman , MD, FACS

## 2020-05-21 NOTE — Anesthesia Procedure Notes (Signed)
Procedure Name: LMA Insertion Performed by: Jalyssa Fleisher, CRNA Pre-anesthesia Checklist: Patient identified, Patient being monitored, Timeout performed, Emergency Drugs available and Suction available Patient Re-evaluated:Patient Re-evaluated prior to induction Oxygen Delivery Method: Circle system utilized Preoxygenation: Pre-oxygenation with 100% oxygen Induction Type: IV induction Ventilation: Mask ventilation without difficulty LMA: LMA inserted LMA Size: 3.5 Tube type: Oral Number of attempts: 1 Placement Confirmation: positive ETCO2 and breath sounds checked- equal and bilateral Tube secured with: Tape Dental Injury: Teeth and Oropharynx as per pre-operative assessment        

## 2020-05-21 NOTE — Anesthesia Postprocedure Evaluation (Signed)
Anesthesia Post Note  Patient: Cathy Garrett  Procedure(s) Performed: BREAST LUMPECTOMY (Right )  Patient location during evaluation: PACU Anesthesia Type: General Level of consciousness: awake and alert Pain management: pain level controlled Vital Signs Assessment: post-procedure vital signs reviewed and stable Respiratory status: spontaneous breathing, nonlabored ventilation, respiratory function stable and patient connected to nasal cannula oxygen Cardiovascular status: blood pressure returned to baseline and stable Postop Assessment: no apparent nausea or vomiting Anesthetic complications: no   No complications documented.   Last Vitals:  Vitals:   05/21/20 0834 05/21/20 1205  BP: (!) 105/61 (!) 96/51  Pulse: 47 71  Resp: 16 14  Temp: (!) 36.2 C (!) 36 C  SpO2: 100% 99%    Last Pain:  Vitals:   05/21/20 0834  TempSrc: Tympanic  PainSc: Dent Chyna Kneece

## 2020-05-21 NOTE — Anesthesia Preprocedure Evaluation (Signed)
Anesthesia Evaluation  Patient identified by MRN, date of birth, ID band Patient awake    Reviewed: Allergy & Precautions, H&P , NPO status , Patient's Chart, lab work & pertinent test results, reviewed documented beta blocker date and time   Airway Mallampati: II  TM Distance: >3 FB Neck ROM: full    Dental  (+) Teeth Intact   Pulmonary neg pulmonary ROS,    Pulmonary exam normal        Cardiovascular Exercise Tolerance: Good negative cardio ROS Normal cardiovascular exam Rate:Normal     Neuro/Psych  Headaches, negative psych ROS   GI/Hepatic negative GI ROS, Neg liver ROS,   Endo/Other  negative endocrine ROS  Renal/GU negative Renal ROS  negative genitourinary   Musculoskeletal   Abdominal   Peds  Hematology negative hematology ROS (+)   Anesthesia Other Findings   Reproductive/Obstetrics negative OB ROS                             Anesthesia Physical Anesthesia Plan  ASA: II  Anesthesia Plan: General LMA   Post-op Pain Management:    Induction:   PONV Risk Score and Plan:   Airway Management Planned:   Additional Equipment:   Intra-op Plan:   Post-operative Plan:   Informed Consent: I have reviewed the patients History and Physical, chart, labs and discussed the procedure including the risks, benefits and alternatives for the proposed anesthesia with the patient or authorized representative who has indicated his/her understanding and acceptance.       Plan Discussed with: CRNA  Anesthesia Plan Comments:         Anesthesia Quick Evaluation

## 2020-05-21 NOTE — Discharge Instructions (Signed)

## 2020-05-21 NOTE — Interval H&P Note (Signed)
History and Physical Interval Note:  05/21/2020 10:10 AM  Cathy Garrett  has presented today for surgery, with the diagnosis of Right fibroadenoma.  The various methods of treatment have been discussed with the patient and family. After consideration of risks, benefits and other options for treatment, the patient has consented to  Procedure(s): BREAST LUMPECTOMY (Right) as a surgical intervention.  The patient's history has been reviewed, patient examined, no change in status, stable for surgery.  I have reviewed the patient's chart and labs.  Questions were answered to the patient's satisfaction.     [place]

## 2020-05-21 NOTE — Transfer of Care (Signed)
Immediate Anesthesia Transfer of Care Note  Patient: Cathy Garrett  Procedure(s) Performed: BREAST LUMPECTOMY (Right )  Patient Location: PACU  Anesthesia Type:General  Level of Consciousness: drowsy  Airway & Oxygen Therapy: Patient Spontanous Breathing and Patient connected to face mask oxygen  Post-op Assessment: Report given to RN and Post -op Vital signs reviewed and stable  Post vital signs: Reviewed and stable  Last Vitals:  Vitals Value Taken Time  BP 96/51 05/21/20 1206  Temp 36 C 05/21/20 1205  Pulse 72 05/21/20 1206  Resp 15 05/21/20 1206  SpO2 100 % 05/21/20 1206  Vitals shown include unvalidated device data.  Last Pain:  Vitals:   05/21/20 0834  TempSrc: Tympanic  PainSc: 1          Complications: No complications documented.

## 2020-05-22 LAB — SURGICAL PATHOLOGY

## 2020-06-10 ENCOUNTER — Other Ambulatory Visit: Payer: Self-pay

## 2020-06-10 ENCOUNTER — Encounter: Payer: Self-pay | Admitting: Surgery

## 2020-06-10 ENCOUNTER — Ambulatory Visit (INDEPENDENT_AMBULATORY_CARE_PROVIDER_SITE_OTHER): Payer: Medicaid Other | Admitting: Surgery

## 2020-06-10 VITALS — BP 107/73 | HR 56 | Temp 99.7°F | Ht 63.0 in | Wt 157.4 lb

## 2020-06-10 DIAGNOSIS — Z09 Encounter for follow-up examination after completed treatment for conditions other than malignant neoplasm: Secondary | ICD-10-CM

## 2020-06-10 NOTE — Progress Notes (Signed)
34 year old female status post lumpectomy right side for fibroadenoma.  Pathology reviewed with patient.  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  She is doing very well with minimal discomfort.  No fevers no chills  Exam: No acute distress.  Wound is healing very well without any issues.  There is no deformity there is no hematomas.  No infection   Assessment plan: Doing well without complications return to clinic in 3 months for  last checkup.

## 2020-06-10 NOTE — Patient Instructions (Addendum)
Dr.Pabon recommends patient to follow up in three months with our office.   Breast Self-Awareness Breast self-awareness is knowing how your breasts look and feel. Doing breast self-awareness is important. It allows you to catch a breast problem early while it is still small and can be treated. All women should do breast self-awareness, including women who have had breast implants. Tell your doctor if you notice a change in your breasts. What you need:  A mirror.  A well-lit room. How to do a breast self-exam A breast self-exam is one way to learn what is normal for your breasts and to check for changes. To do a breast self-exam: Look for changes  1. Take off all the clothes above your waist. 2. Stand in front of a mirror in a room with good lighting. 3. Put your hands on your hips. 4. Push your hands down. 5. Look at your breasts and nipples in the mirror to see if one breast or nipple looks different from the other. Check to see if: ? The shape of one breast is different. ? The size of one breast is different. ? There are wrinkles, dips, and bumps in one breast and not the other. 6. Look at each breast for changes in the skin, such as: ? Redness. ? Scaly areas. 7. Look for changes in your nipples, such as: ? Liquid around the nipples. ? Bleeding. ? Dimpling. ? Redness. ? A change in where the nipples are. Feel for changes  1. Lie on your back on the floor. 2. Feel each breast. To do this, follow these steps: ? Pick a breast to feel. ? Put the arm closest to that breast above your head. ? Use your other arm to feel the nipple area of your breast. Feel the area with the pads of your three middle fingers by making small circles with your fingers. For the first circle, press lightly. For the second circle, press harder. For the third circle, press even harder. ? Keep making circles with your fingers at the different pressures as you move down your breast. Stop when you feel your  ribs. ? Move your fingers a little toward the center of your body. ? Start making circles with your fingers again, this time going up until you reach your collarbone. ? Keep making up-and-down circles until you reach your armpit. Remember to keep using the three pressures. ? Feel the other breast in the same way. 3. Sit or stand in the tub or shower. 4. With soapy water on your skin, feel each breast the same way you did in step 2 when you were lying on the floor. Write down what you find Writing down what you find can help you remember what to tell your doctor. Write down:  What is normal for each breast.  Any changes you find in each breast, including: ? The kind of changes you find. ? Whether you have pain. ? Size and location of any lumps.  When you last had your menstrual period. General tips  Check your breasts every month.  If you are breastfeeding, the best time to check your breasts is after you feed your baby or after you use a breast pump.  If you get menstrual periods, the best time to check your breasts is 5-7 days after your menstrual period is over.  With time, you will become comfortable with the self-exam, and you will begin to know if there are changes in your breasts. Contact a doctor if  you:  See a change in the shape or size of your breasts or nipples.  See a change in the skin of your breast or nipples, such as red or scaly skin.  Have fluid coming from your nipples that is not normal.  Find a lump or thick area that was not there before.  Have pain in your breasts.  Have any concerns about your breast health. Summary  Breast self-awareness includes looking for changes in your breasts, as well as feeling for changes within your breasts.  Breast self-awareness should be done in front of a mirror in a well-lit room.  You should check your breasts every month. If you get menstrual periods, the best time to check your breasts is 5-7 days after your  menstrual period is over.  Let your doctor know of any changes you see in your breasts, including changes in size, changes on the skin, pain or tenderness, or fluid from your nipples that is not normal. This information is not intended to replace advice given to you by your health care provider. Make sure you discuss any questions you have with your health care provider. Document Revised: 05/29/2018 Document Reviewed: 05/29/2018 Elsevier Patient Education  [employer].

## 2020-08-27 ENCOUNTER — Other Ambulatory Visit: Payer: Self-pay

## 2020-08-27 ENCOUNTER — Ambulatory Visit (INDEPENDENT_AMBULATORY_CARE_PROVIDER_SITE_OTHER): Payer: Medicaid Other | Admitting: Dermatology

## 2020-08-27 DIAGNOSIS — L578 Other skin changes due to chronic exposure to nonionizing radiation: Secondary | ICD-10-CM

## 2020-08-27 DIAGNOSIS — L821 Other seborrheic keratosis: Secondary | ICD-10-CM

## 2020-08-27 DIAGNOSIS — D489 Neoplasm of uncertain behavior, unspecified: Secondary | ICD-10-CM

## 2020-08-27 DIAGNOSIS — L719 Rosacea, unspecified: Secondary | ICD-10-CM

## 2020-08-27 DIAGNOSIS — D220 Melanocytic nevi of lip: Secondary | ICD-10-CM

## 2020-08-27 MED ORDER — DOXYCYCLINE HYCLATE 20 MG PO TABS: 20.0000 mg | ORAL_TABLET | Freq: Two times a day (BID) | ORAL | 0 refills | Status: AC

## 2020-08-27 NOTE — Patient Instructions (Signed)
Seborrheic Keratosis  What causes seborrheic keratoses? Seborrheic keratoses are harmless, common skin growths that first appear during adult life.  As time goes by, more growths appear.  Some people may develop a large number of them.  Seborrheic keratoses appear on both covered and uncovered body parts.  They are not caused by sunlight.  The tendency to develop seborrheic keratoses can be inherited.  They vary in color from skin-colored to gray, brown, or even black.  They can be either smooth or have a rough, warty surface.   Seborrheic keratoses are superficial and look as if they were stuck on the skin.  Under the microscope this type of keratosis looks like layers upon layers of skin.  That is why at times the top layer may seem to fall off, but the rest of the growth remains and re-grows.    Treatment Seborrheic keratoses do not need to be treated, but can easily be removed in the office.  Seborrheic keratoses often cause symptoms when they rub on clothing or jewelry.  Lesions can be in the way of shaving.  If they become inflamed, they can cause itching, soreness, or burning.  Removal of a seborrheic keratosis can be accomplished by freezing, burning, or surgery. If any spot bleeds, scabs, or grows rapidly, please return to have it checked, as these can be an indication of a skin cancer.   Rosacea  What is rosacea? Rosacea (say: ro-zay-sha) is a common skin disease that usually begins as a trend of flushing or blushing easily.  As rosacea progresses, a persistent redness in the center of the face will develop and may gradually spread beyond the nose and cheeks to the forehead and chin.  In some cases, the ears, chest, and back could be affected.  Rosacea may appear as tiny blood vessels or small red bumps that occur in crops.  Frequently they can contain pus, and are called "pustules".  If the bumps do not contain pus, they are referred to as "papules".  Rarely, in prolonged, untreated cases of  rosacea, the oil glands of the nose and cheeks may become permanently enlarged.  This is called rhinophyma, and is seen more frequently in men.  Signs and Risks In its beginning stages, rosacea tends to come and go, which makes it difficult to recognize.  It can start as intermittent flushing of the face.  Eventually, blood vessels may become permanently visible.  Pustules and papules can appear, but can be mistaken for adult acne.  People of all races, ages, genders and ethnic groups are at risk of developing rosacea.  However, it is more common in women (especially around menopause) and adults with fair skin between the ages of 88 and 31.  Treatment Dermatologists typically recommend a combination of treatments to effectively manage rosacea.  Treatment can improve symptoms and may stop the progression of the rosacea.  Treatment may involve both topical and oral medications.  The tetracycline antibiotics are often used for their anti-inflammatory effect; however, because of the possibility of developing antibiotic resistance, they should not be used long term at full dose.  For dilated blood vessels the options include electrodessication (uses electric current through a small needle), laser treatment, and cosmetics to hide the redness.   With all forms of treatment, improvement is a slow process, and patients may not see any results for the first 3-4 weeks.  It is very important to avoid the sun and other triggers.  Patients must wear sunscreen daily.  Skin  Care Instructions: 1. Cleanse the skin with a mild soap such as CeraVe cleanser, Cetaphil cleanser, or Dove soap once or twice daily as needed. 2. Moisturize with Eucerin Redness Relief Daily Perfecting Lotion (has a subtle green tint), CeraVe Moisturizing Cream, or Oil of Olay Daily Moisturizer with sunscreen every morning and/or night as recommended. 3. Makeup should be "non-comedogenic" (won't clog pores) and be labeled "for sensitive skin". Good  choices for cosmetics are: Neutrogena, Almay, and Physician's Formula.  Any product with a green tint tends to offset a red complexion. 4. If your eyes are dry and irritated, use artificial tears 2-3 times per day and cleanse the eyelids daily with baby shampoo.  Have your eyes examined at least every 2 years.  Be sure to tell your eye doctor that you have rosacea. 5. Alcoholic beverages tend to cause flushing of the skin, and may make rosacea worse. 6. Always wear sunscreen, protect your skin from extreme hot and cold temperatures, and avoid spicy foods, hot drinks, and mechanical irritation such as rubbing, scrubbing, or massaging the face.  Avoid harsh skin cleansers, cleansing masks, astringents, and exfoliation. If a particular product burns or makes your face feel tight, then it is likely to flare your rosacea. 7. If you are having difficulty finding a sunscreen that you can tolerate, you may try switching to a chemical-free sunscreen.  These are ones whose active ingredient is zinc oxide or titanium dioxide only.  They should also be fragrance free, non-comedogenic, and labeled for sensitive skin. 8. Rosacea triggers may vary from person to person.  There are a variety of foods that have been reported to trigger rosacea.  Some patients find that keeping a diary of what they were doing when they flared helps them avoid triggers.

## 2020-08-27 NOTE — Progress Notes (Signed)
New Patient Visit  Subjective  Cathy Garrett is a 34 y.o. female who presents for the following: Nevus (Pt has a mole she would like checked under right nostril. Has been growing in size, itching, occasional redness. No hx of dysplastic nevi or skin cancer. ).  She also has some redness, bumps and irritation at mid-face present for a long time. She does have some dry eye but no gritty sensation.   Objective  Well appearing patient in no apparent distress; mood and affect are within normal limits.  A focused examination was performed including face, neck, eyelids, lips. Relevant physical exam findings are noted in the Assessment and Plan.  Objective  rigth upper cutaneuos lip: 0.6 cm brown papule R/o irritated nevus vs other     Objective  face: Mid face erythema with telangiectasias and scattered inflammatory papules.   Assessment & Plan  Neoplasm of uncertain behavior rigth upper cutaneuos lip  Epidermal / dermal shaving  Lesion diameter (cm):  0.6 Informed consent: discussed and consent obtained   Timeout: patient name, date of birth, surgical site, and procedure verified   Procedure prep:  Patient was prepped and draped in usual sterile fashion Prep type:  Isopropyl alcohol Anesthesia: the lesion was anesthetized in a standard fashion   Anesthetic:  1% lidocaine w/ epinephrine 1-100,000 buffered w/ 8.4% NaHCO3 Instrument used: flexible razor blade   Hemostasis achieved with: pressure, aluminum chloride and electrodesiccation   Outcome: patient tolerated procedure well   Post-procedure details: sterile dressing applied and wound care instructions given   Dressing type: bandage and petrolatum    Specimen 1 - Surgical pathology Differential Diagnosis: R/o irritated nevus vs other Check Margins: No 0.6 cm brown papule   Tube removal discussed expected scar and possibility of of dark spot at the area.    Recommend Serica scar cream nightly once this is  healed if desired for improved scar remodeling  Advised pt to call office if she feels any thickening in the area after removal. Would plan to use [place] triamcinolone injection to thin the area/prevent a hypertrophic scar  Rosacea face  Chronic, flared  Rosacea is a chronic progressive skin condition usually affecting the face of adults. It is treatable but not curable. It sometimes affects the eyes (ocular rosacea) as well. It may respond to topical and/or systemic medication and can flare with stress, sun exposure, alcohol, exercise and some foods.  + Dry eye  Start doxycycline BID with food.    Doxycycline should be taken with food to prevent nausea. Do not lay down for 30 minutes after taking. Be cautious with sun exposure and use good sun protection while on this medication. Pregnant women should not take this medication.   Will consider switching to a cream if no improvement with dry eyes.  Ordered Medications: doxycycline (PERIOSTAT) 20 MG tablet  Seborrheic Keratoses - Stuck-on, waxy, tan-brown papules and plaques  - Discussed benign etiology and prognosis. - Observe - Call for any changes  Actinic Damage - chronic, secondary to cumulative UV radiation exposure/sun exposure over time - diffuse scaly erythematous macules with underlying dyspigmentation - Recommend daily broad spectrum sunscreen SPF 30+ to sun-exposed areas, reapply every 2 hours as needed.  - Call for new or changing lesions.  Return in about 2 months (around 10/27/2020) for rosacea f/u.   I, Harriett Sine, CMA, am acting as scribe for Forest Gleason, MD.  Documentation: I have reviewed the above documentation for accuracy and completeness, and I agree  with the above.  Forest Gleason, MD

## 2020-09-01 NOTE — Progress Notes (Signed)
Skin , right upper cutaneous lip MELANOCYTIC NEVUS, INTRADERMAL TYPE   This is a NORMAL MOLE. No additional treatment is needed. If you notice any new or changing spots or have other skin concerns in future, please call our office at [phone].

## 2020-09-09 ENCOUNTER — Encounter: Payer: Self-pay | Admitting: Surgery

## 2020-09-09 ENCOUNTER — Other Ambulatory Visit: Payer: Self-pay

## 2020-09-09 ENCOUNTER — Ambulatory Visit (INDEPENDENT_AMBULATORY_CARE_PROVIDER_SITE_OTHER): Payer: Medicaid Other | Admitting: Surgery

## 2020-09-09 VITALS — BP 104/64 | HR 56 | Temp 97.9°F | Ht 63.0 in | Wt 158.2 lb

## 2020-09-09 DIAGNOSIS — D241 Benign neoplasm of right breast: Secondary | ICD-10-CM

## 2020-09-09 NOTE — Patient Instructions (Addendum)
Autoexamen de [employer] Breast Self-Awareness El autoexamen de mamas es para Civil engineer, contracting la apariencia y la sensibilidad de las [place]. Es importante autoexaminarse las [place]. Permite detectar un problema en las mamas a tiempo mientras todava es pequeo y puede tratarse. Todas las mujeres deben autoexaminarse las [place], incluso aquellas que se sometieron a implantes mamarios. Informe al mdico si advierte un cambio en las mamas. Lo que necesita:  Un espejo.  Una habitacin bien iluminada. Cmo realizar el autoexamen de mamas El autoexamen de [place] es una forma de aprender qu es normal para sus mamas y revisar si hay cambios. Para hacer un autoexamen de las mamas: Busque cambios  1. Qutese toda la ropa por encima de la cintura. 2. Prese frente a un espejo en una habitacin con buena iluminacin. 3. Apoye las manos en las caderas. 4. Empuje hacia abajo con las manos. [place] y los pezones en el espejo para ver si una mama o un pezn se ve diferente del otro. Durante el examen, intente determinar si: ? La forma de una mama es diferente. ? El tamao de [place] mama es [place]. ? Hay arrugas, depresiones y protuberancias en [place] y no en la otra. 6. Observe cada mama para buscar cambios en la piel, por ejemplo: ? Enrojecimiento. ? Zonas escamosas. 7. Observe si hay cambios en los pezones, por ejemplo: ? Lquido alrededor [employer]. ? [place]. ? Hoyuelos. ? Enrojecimiento. ? Un cambio en el lugar de los pezones. [place] si hay cambios  1. Acustese en el piso boca arriba. 2. Plpese cada mama. Para hacerlo, siga estos pasos: ? Elija una mama para palpar. ? Coloque el brazo ms cercano a esa mama por encima de la cabeza. ? Use el otro brazo para palpar la zona del pezn de la mama. Plpese la zona con las yemas de los tres dedos del medio y haga crculos con los dedos. Con el primer crculo, presione suavemente. Con el segundo, ms fuerte. Con el tercero, an ms  fuerte. ? Siga haciendo crculos con los dedos con las diferentes presiones a medida que desciende por la mama. Detngase cuando sienta las costillas. ? Desplace los dedos un poco hacia el centro del cuerpo. ? Empiece a hacer crculos con los dedos nuevamente y esta vez haga movimientos ascendentes hasta llegar a la clavcula. ? Siga haciendo crculos [place] y [place] llegar a la axila. Recuerde hacerlos con las tres presiones. ? Plpese la otra mama de la misma forma. 3. Sintese o prese en la ducha o la baera. 4. Con agua jabonosa en la piel, plpese cada mama del mismo modo que lo hizo en el paso2 mientras estaba acostada en el piso. Anote sus hallazgos Anotar lo que encuentra puede ayudarla a recordar qu contarle al mdico. [place] los siguientes datos:  Qu es normal para cada mama.  Cualquier cambio que encuentre en cada mama, por ejemplo: ? La clase de cambios que encuentra. ? Si tiene dolor. ? Si hay bultos, su tamao y [place].  Cundo tuvo su ltimo perodo menstrual. Consejos generales  [facility].  Si est amamantando, el mejor momento para el examen de las mamas es despus de darle de Administrator, arts al beb o despus de usar un sacaleches.  Si tiene perodos menstruales, el mejor momento para hacerlo es de 5 a 7das despus de la finalizado el perodo menstrual.  Con el tiempo, se sentir cmoda con el autoexamen y Medical laboratory scientific officer a saber si  hay cambios en sus mamas. Comunquese con un mdico si:  Observa un cambio en la forma o el tamao de las mamas o los pezones.  Observa un cambio en la piel de las mamas o los pezones, como la piel enrojecida o escamosa.  Tiene secrecin de lquido proveniente de los pezones que no es normal.  [place] un ndulo o una zona engrosada que no tena antes.  Tiene dolor en las [place].  Tiene alguna inquietud [employer] de la [place]. Resumen  El autoexamen de mamas incluye buscar cambios en las  [place], y tambin palpar para Hydrographic surveyor cualquier cambio en las [place].  El autoexamen de mamas debe hacerse frente a un espejo en una habitacin bien iluminada.  Debe revisarse las [employer]. Si tiene perodos menstruales, el mejor momento para hacerlo es de 5 a 7das despus de la finalizado el perodo menstrual.  Informe al mdico si ve cambios en las mamas, como cambios en el tamao, cambios en la piel, Social research officer, government o sensibilidad, o un lquido inusual que sale de los pezones. Esta informacin no tiene Marine scientist el consejo del mdico. Asegrese de hacerle al mdico cualquier pregunta que tenga. Document Revised: 07/10/2018 Document Reviewed: 07/10/2018 Elsevier Patient Education  [place].

## 2020-09-09 NOTE — Progress Notes (Signed)
Outpatient Surgical Follow Up  09/09/2020  Cathy Garrett is an 34 y.o. female.   Chief Complaint  Patient presents with  . Follow-up    3 mon f/u, s/p R breast lumpectomy w/Dr. Dahlia Byes (05/21/20)    HPI: S/p excision Right  fibroadenoma w usual hyperplasia.Very seldom discomfort on right breast when wearing tight Bras. No fevers, no chills, no masses. No nipple DC, no weight loss  Past Medical History:  Diagnosis Date  . Headache    MIGRAINES    Past Surgical History:  Procedure Laterality Date  . BREAST LUMPECTOMY Right 05/21/2020   Procedure: BREAST LUMPECTOMY;  Surgeon: Jules Husbands, MD;  Location: [facility] ORS;  Service: General;  Laterality: Right;  . LEEP  10/26/2016   [facility]    Family History  Problem Relation Age of Onset  . Breast cancer Neg Hx     Social History:  reports that she has never smoked. She has never used smokeless tobacco. She reports current alcohol use. She reports that she does not use drugs.  Allergies: No Known Allergies  Medications reviewed.    ROS Full ROS performed and is otherwise negative other than what is stated in HPI   BP 104/64   Pulse (!) 56   Temp 97.9 F (36.6 C) (Oral)   Ht 5\' 3"  (1.6 m)   Wt 158 lb 3.2 oz (71.8 kg)   SpO2 97%   BMI 28.02 kg/m   Physical Exam NAD Neck: no masses, trach midline , no LAD , no JVD Breast: right lumpectomy scar, no masses, no infection. No deformity. Breasts are very symmetrical and with very good cosmetic outcome. NO palpable masses or LAD.     Assessment/Plan: S/p excision Right  fibroadenoma w usual hyperplasia. No issues at this time. Recommend Yearly mammo starting at age 31 and monthly self exams. RTC prn  Greater than 50% of the 20 minutes  visit was spent in counseling/coordination of care   Caroleen Hamman, MD [place] Surgeon

## 2020-09-14 ENCOUNTER — Encounter: Payer: Self-pay | Admitting: Dermatology

## 2020-11-18 ENCOUNTER — Ambulatory Visit (INDEPENDENT_AMBULATORY_CARE_PROVIDER_SITE_OTHER): Payer: Medicaid Other | Admitting: Dermatology

## 2020-11-18 ENCOUNTER — Other Ambulatory Visit: Payer: Self-pay

## 2020-11-18 DIAGNOSIS — L719 Rosacea, unspecified: Secondary | ICD-10-CM | POA: Diagnosis not present

## 2020-11-18 DIAGNOSIS — L308 Other specified dermatitis: Secondary | ICD-10-CM | POA: Diagnosis not present

## 2020-11-18 DIAGNOSIS — L988 Other specified disorders of the skin and subcutaneous tissue: Secondary | ICD-10-CM | POA: Diagnosis not present

## 2020-11-18 DIAGNOSIS — L700 Acne vulgaris: Secondary | ICD-10-CM | POA: Diagnosis not present

## 2020-11-18 MED ORDER — HYDROCORTISONE 2.5 % EX CREA: TOPICAL_CREAM | Freq: Two times a day (BID) | CUTANEOUS | 1 refills | Status: AC | PRN

## 2020-11-18 NOTE — Patient Instructions (Addendum)
Topical steroids (such as triamcinolone, fluocinolone, fluocinonide, mometasone, clobetasol, halobetasol, betamethasone, hydrocortisone) can cause thinning and lightening of the skin if they are used for too long in the same area. Your physician has selected the right strength medicine for your problem and area affected on the body. Please use your medication only as directed by your physician to prevent side effects.   Gentle Skin Care Guide  1. Bathe no more than once a day.  2. Avoid bathing in hot water  3. Use a mild soap like Dove, Vanicream, Cetaphil, CeraVe. Can use Lever 2000 or Cetaphil antibacterial soap  4. Use soap only where you need it. On most days, use it under your arms, between your legs, and on your feet. Let the water rinse other areas unless visibly dirty.  5. When you get out of the bath/shower, use a towel to gently blot your skin dry, don't rub it.  6. While your skin is still a little damp, apply a moisturizing cream such as Vanicream, CeraVe, Cetaphil, Eucerin, Sarna lotion or plain Vaseline Jelly. For hands apply Neutrogena [place] Hand Cream or Excipial Hand Cream.  7. Reapply moisturizer any time you start to itch or feel dry.  8. Sometimes using free and clear laundry detergents can be helpful. Fabric softener sheets should be avoided. Downy Free & Gentle liquid, or any liquid fabric softener that is free of dyes and perfumes, it acceptable to use  9. If your doctor has given you prescription creams you may apply moisturizers over them     

## 2020-11-18 NOTE — Progress Notes (Signed)
   Follow-Up Visit   Subjective  Cathy Garrett is a 35 y.o. female who presents for the following: Follow-up (Patient here today for 2 month rosacea follow up. ).  Patient was taking doxycycline 20mg  twice a day but did not notice any improvement.  Patient also asking about Botox vs fillers for lines at eyes.  The following portions of the chart were reviewed this encounter and updated as appropriate:   Tobacco  Allergies  Meds  Problems  Med Hx  Surg Hx  Fam Hx      Review of Systems:  No other skin or systemic complaints except as noted in HPI or Assessment and Plan.  Objective  Well appearing patient in no apparent distress; mood and affect are within normal limits.  A focused examination was performed including face. Relevant physical exam findings are noted in the Assessment and Plan.  Objective  Face: Scaly pink papules coalescing to plaques   Objective  Face: Trace open comedones  Objective  Face: Rhytides and volume loss.   Objective  Nose: Mid-face erythema   Assessment & Plan  Other eczema Face  Favor dermatitis  Recommend gentle skin care, guide attached to AVS.   Start HC 2.5% cream twice daily for up to 2 weeks for rash.   Patient to call in 2 weeks if not improving.   Topical steroids (such as triamcinolone, fluocinolone, fluocinonide, mometasone, clobetasol, halobetasol, betamethasone, hydrocortisone) can cause thinning and lightening of the skin if they are used for too long in the same area. Your physician has selected the right strength medicine for your problem and area affected on the body. Please use your medication only as directed by your physician to prevent side effects.    Ordered Medications: hydrocortisone 2.5 % cream  Acne vulgaris Face  Not bothersome for patient  Consider treatment if becomes bothersome  Elastosis of skin Face  Recommend Restylane Defyne to nasolabial folds.   Recommend Restylane Refyne  to tear troughs.  Recommend Botox to treat crows feet. Dosing 6-8 units each side.  Rosacea Nose  + dry eye but no grittiness  No improvement with doxycycline. Will defer treatment at this time.  Return if symptoms worsen or fail to improve.  Graciella Belton, RMA, am acting as scribe for Forest Gleason, MD .  Documentation: I have reviewed the above documentation for accuracy and completeness, and I agree with the above.  Forest Gleason, MD

## 2020-11-19 ENCOUNTER — Encounter: Payer: Self-pay | Admitting: Dermatology

## 2021-04-23 IMAGING — MG MM BREAST SURGICAL SPECIMEN
1 series · 1 of 1 positions shown · non-contrast
Comparison: Previous exam(s).

CLINICAL DATA: Status post excision of a palpable right breast
lesion, biopsied on 06/07/2019 revealing a fibroadenoma.

EXAM:
SPECIMEN RADIOGRAPH OF THE RIGHT BREAST

[R]
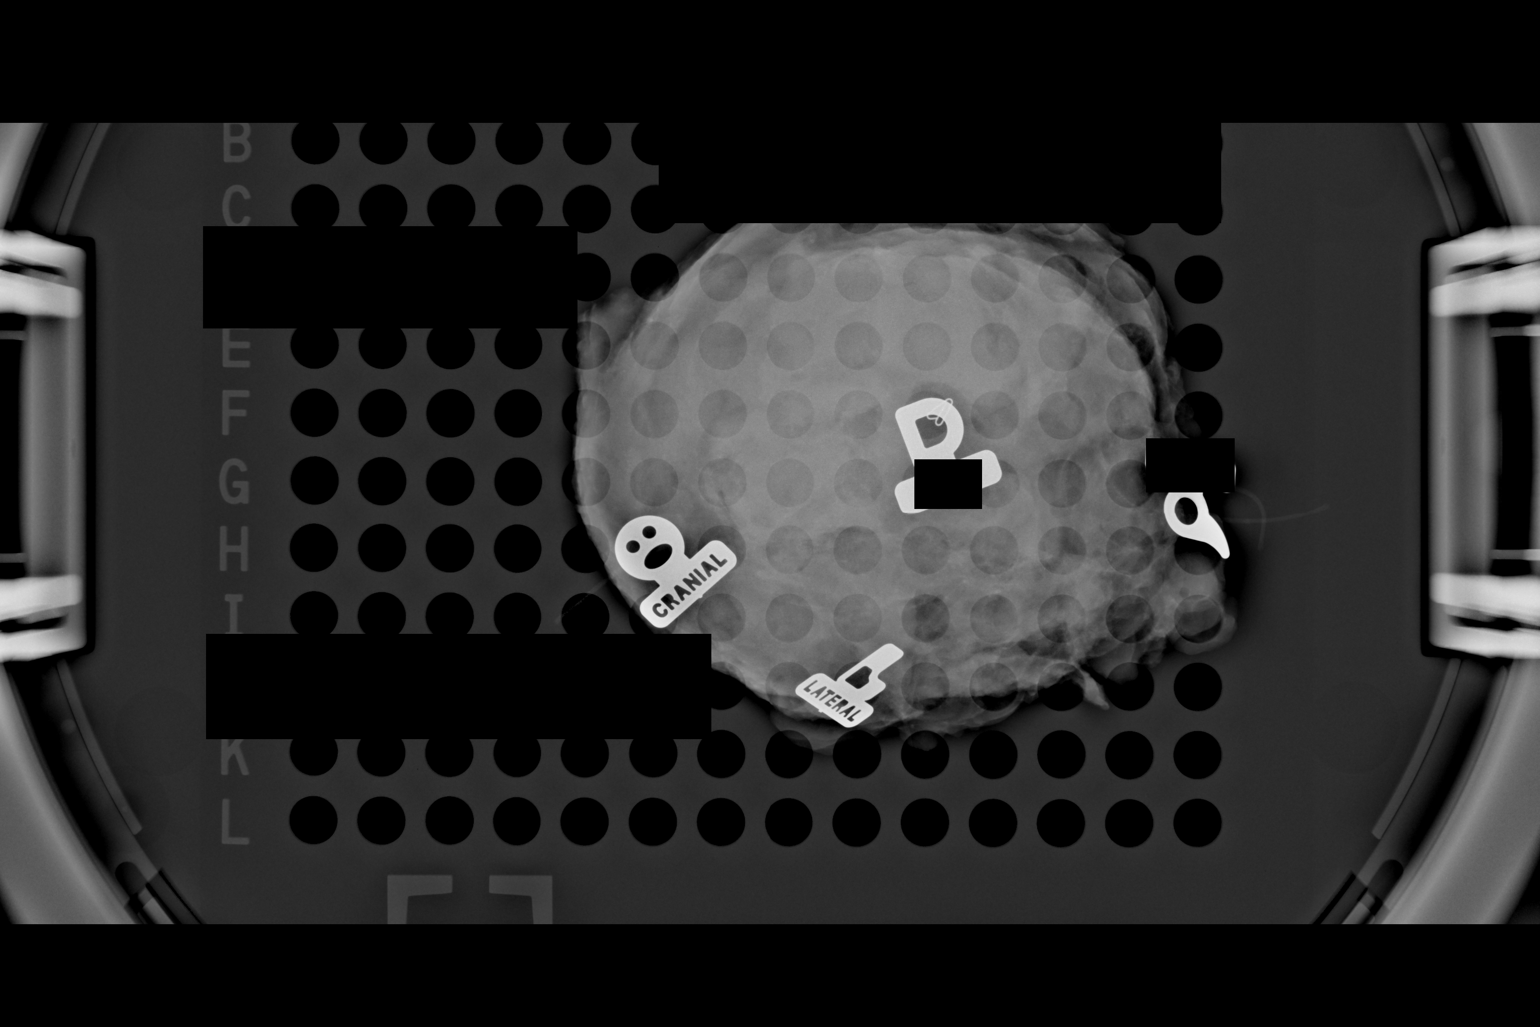

[1 of 1 positions shown; findings below may reference images not displayed]

FINDINGS: Status post excision of the right breast. The heart shaped marker
clip lies within the center of the specimen.
IMPRESSION: Specimen radiograph of the right breast.

## 2022-11-01 ENCOUNTER — Other Ambulatory Visit: Payer: Self-pay | Admitting: Family Medicine

## 2022-11-01 DIAGNOSIS — R102 Pelvic and perineal pain: Secondary | ICD-10-CM

## 2022-11-02 ENCOUNTER — Ambulatory Visit
Admission: RE | Admit: 2022-11-02 | Discharge: 2022-11-02 | Disposition: A | Discharge status: Home / Self Care | Payer: Medicaid Other | Source: Ambulatory Visit | Attending: Family Medicine | Admitting: Family Medicine

## 2022-11-02 DIAGNOSIS — R102 Pelvic and perineal pain: Secondary | ICD-10-CM
